# Patient Record
Sex: Male | Born: 1940 | Race: White | Hispanic: No | Marital: Married | Smoking: Former smoker
Health system: Southern US, Community
[De-identification: ages and names within clinical notes are randomized; demographics above are authoritative.]

## PROBLEM LIST (undated history)

## (undated) DIAGNOSIS — J449 Chronic obstructive pulmonary disease, unspecified: Secondary | ICD-10-CM

## (undated) DIAGNOSIS — E119 Type 2 diabetes mellitus without complications: Secondary | ICD-10-CM

---

## 2019-03-17 ENCOUNTER — Observation Stay
Admission: EM | Admit: 2019-03-17 | Discharge: 2019-03-18 | Disposition: A | Discharge status: Home / Self Care | Payer: Medicare Other | Attending: Internal Medicine | Admitting: Internal Medicine

## 2019-03-17 ENCOUNTER — Emergency Department
Admission: EM | Admit: 2019-03-17 | Discharge: 2019-03-17 | Disposition: A | Discharge status: Home / Self Care | Payer: Medicare Other | Source: Home / Self Care | Attending: Emergency Medicine | Admitting: Emergency Medicine

## 2019-03-17 ENCOUNTER — Emergency Department: Payer: Medicare Other

## 2019-03-17 ENCOUNTER — Other Ambulatory Visit: Payer: Self-pay

## 2019-03-17 ENCOUNTER — Encounter: Payer: Self-pay | Admitting: Emergency Medicine

## 2019-03-17 DIAGNOSIS — J849 Interstitial pulmonary disease, unspecified: Secondary | ICD-10-CM | POA: Diagnosis not present

## 2019-03-17 DIAGNOSIS — J9621 Acute and chronic respiratory failure with hypoxia: Secondary | ICD-10-CM | POA: Insufficient documentation

## 2019-03-17 DIAGNOSIS — Z955 Presence of coronary angioplasty implant and graft: Secondary | ICD-10-CM | POA: Diagnosis not present

## 2019-03-17 DIAGNOSIS — J449 Chronic obstructive pulmonary disease, unspecified: Secondary | ICD-10-CM | POA: Diagnosis present

## 2019-03-17 DIAGNOSIS — J9620 Acute and chronic respiratory failure, unspecified whether with hypoxia or hypercapnia: Secondary | ICD-10-CM | POA: Diagnosis present

## 2019-03-17 DIAGNOSIS — Z7902 Long term (current) use of antithrombotics/antiplatelets: Secondary | ICD-10-CM | POA: Diagnosis not present

## 2019-03-17 DIAGNOSIS — Z79899 Other long term (current) drug therapy: Secondary | ICD-10-CM | POA: Diagnosis not present

## 2019-03-17 DIAGNOSIS — Z794 Long term (current) use of insulin: Secondary | ICD-10-CM | POA: Insufficient documentation

## 2019-03-17 DIAGNOSIS — K219 Gastro-esophageal reflux disease without esophagitis: Secondary | ICD-10-CM | POA: Diagnosis not present

## 2019-03-17 DIAGNOSIS — I491 Atrial premature depolarization: Secondary | ICD-10-CM | POA: Diagnosis not present

## 2019-03-17 DIAGNOSIS — Z87891 Personal history of nicotine dependence: Secondary | ICD-10-CM | POA: Diagnosis not present

## 2019-03-17 DIAGNOSIS — I2510 Atherosclerotic heart disease of native coronary artery without angina pectoris: Secondary | ICD-10-CM | POA: Diagnosis not present

## 2019-03-17 DIAGNOSIS — Z20828 Contact with and (suspected) exposure to other viral communicable diseases: Secondary | ICD-10-CM | POA: Insufficient documentation

## 2019-03-17 DIAGNOSIS — R0789 Other chest pain: Secondary | ICD-10-CM | POA: Insufficient documentation

## 2019-03-17 DIAGNOSIS — N400 Enlarged prostate without lower urinary tract symptoms: Secondary | ICD-10-CM | POA: Diagnosis not present

## 2019-03-17 DIAGNOSIS — J189 Pneumonia, unspecified organism: Secondary | ICD-10-CM

## 2019-03-17 DIAGNOSIS — E119 Type 2 diabetes mellitus without complications: Secondary | ICD-10-CM | POA: Diagnosis not present

## 2019-03-17 DIAGNOSIS — I700 Atherosclerosis of aorta: Secondary | ICD-10-CM | POA: Insufficient documentation

## 2019-03-17 DIAGNOSIS — J441 Chronic obstructive pulmonary disease with (acute) exacerbation: Secondary | ICD-10-CM

## 2019-03-17 DIAGNOSIS — R079 Chest pain, unspecified: Secondary | ICD-10-CM

## 2019-03-17 DIAGNOSIS — E785 Hyperlipidemia, unspecified: Secondary | ICD-10-CM | POA: Diagnosis not present

## 2019-03-17 DIAGNOSIS — R0602 Shortness of breath: Secondary | ICD-10-CM | POA: Insufficient documentation

## 2019-03-17 HISTORY — DX: Type 2 diabetes mellitus without complications: E11.9

## 2019-03-17 HISTORY — DX: Chronic obstructive pulmonary disease, unspecified: J44.9

## 2019-03-17 LAB — BASIC METABOLIC PANEL
Anion gap: 10 (ref 5–15)
BUN: 24 mg/dL — ABNORMAL HIGH (ref 8–23)
CO2: 23 mmol/L (ref 22–32)
Calcium: 8.9 mg/dL (ref 8.9–10.3)
Chloride: 105 mmol/L (ref 98–111)
Creatinine, Ser: 0.63 mg/dL (ref 0.61–1.24)
GFR calc Af Amer: >60 mL/min (ref >60)
GFR calc non Af Amer: >60 mL/min (ref >60)
Glucose, Bld: 197 mg/dL — ABNORMAL HIGH (ref 70–99)
Potassium: 4.2 mmol/L (ref 3.5–5.1)
Sodium: 138 mmol/L (ref 135–145)

## 2019-03-17 LAB — BRAIN NATRIURETIC PEPTIDE: B Natriuretic Peptide: 106 pg/mL — ABNORMAL HIGH (ref 0.0–100.0)

## 2019-03-17 LAB — SARS CORONAVIRUS 2 (TAT 6-24 HRS): SARS Coronavirus 2: NEGATIVE

## 2019-03-17 LAB — TROPONIN I (HIGH SENSITIVITY)
Troponin I (High Sensitivity): 18 ng/L — ABNORMAL HIGH (ref <18)
Troponin I (High Sensitivity): 14 ng/L (ref <18)

## 2019-03-17 LAB — CBC
HCT: 38.6 % — ABNORMAL LOW (ref 39.0–52.0)
Hemoglobin: 12.1 g/dL — ABNORMAL LOW (ref 13.0–17.0)
MCH: 27.4 pg (ref 26.0–34.0)
MCHC: 31.3 g/dL (ref 30.0–36.0)
MCV: 87.5 fL (ref 80.0–100.0)
Platelets: 279 10*3/uL (ref 150–400)
RBC: 4.41 MIL/uL (ref 4.22–5.81)
RDW: 16.1 % — ABNORMAL HIGH (ref 11.5–15.5)
WBC: 7.4 10*3/uL (ref 4.0–10.5)
nRBC: 0 % (ref 0.0–0.2)

## 2019-03-17 LAB — GLUCOSE, CAPILLARY: Glucose-Capillary: 174 mg/dL — ABNORMAL HIGH (ref 70–99)

## 2019-03-17 LAB — LACTIC ACID, PLASMA: Lactic Acid, Venous: 2.2 mmol/L (ref 0.5–1.9)

## 2019-03-17 LAB — PROCALCITONIN: Procalcitonin: <0.1 ng/mL

## 2019-03-17 MED ORDER — INSULIN ASPART 100 UNIT/ML ~~LOC~~ SOLN: 0.0000 [IU] | Freq: Every day | SUBCUTANEOUS | Status: DC

## 2019-03-17 MED ORDER — PANTOPRAZOLE SODIUM 40 MG PO TBEC
40.0000 mg | DELAYED_RELEASE_TABLET | Freq: Every day | ORAL | Status: DC
  Administered 2019-03-18: 40 mg via ORAL
  Filled 2019-03-17: qty 1

## 2019-03-17 MED ORDER — AMLODIPINE BESYLATE 5 MG PO TABS
5.0000 mg | ORAL_TABLET | Freq: Every day | ORAL | Status: DC
  Administered 2019-03-18: 5 mg via ORAL
  Filled 2019-03-17: qty 1

## 2019-03-17 MED ORDER — INSULIN ASPART 100 UNIT/ML ~~LOC~~ SOLN
0.0000 [IU] | Freq: Three times a day (TID) | SUBCUTANEOUS | Status: DC
  Administered 2019-03-18: 2 [IU] via SUBCUTANEOUS
  Administered 2019-03-18: 5 [IU] via SUBCUTANEOUS
  Filled 2019-03-17 (×2): qty 1

## 2019-03-17 MED ORDER — PRAVASTATIN SODIUM 20 MG PO TABS
20.0000 mg | ORAL_TABLET | Freq: Every day | ORAL | Status: DC
  Administered 2019-03-17: 20 mg via ORAL
  Filled 2019-03-17: qty 1

## 2019-03-17 MED ORDER — SODIUM CHLORIDE 0.9 % IV SOLN
500.0000 mg | Freq: Once | INTRAVENOUS | Status: AC
  Administered 2019-03-17: 500 mg via INTRAVENOUS
  Filled 2019-03-17: qty 500

## 2019-03-17 MED ORDER — ACETAMINOPHEN 325 MG PO TABS: 650.0000 mg | ORAL_TABLET | Freq: Four times a day (QID) | ORAL | Status: DC | PRN

## 2019-03-17 MED ORDER — ALBUTEROL SULFATE (2.5 MG/3ML) 0.083% IN NEBU
2.5000 mg | INHALATION_SOLUTION | RESPIRATORY_TRACT | Status: DC | PRN
  Filled 2019-03-17: qty 3

## 2019-03-17 MED ORDER — ACETAMINOPHEN 650 MG RE SUPP: 650.0000 mg | Freq: Four times a day (QID) | RECTAL | Status: DC | PRN

## 2019-03-17 MED ORDER — OXYBUTYNIN CHLORIDE ER 10 MG PO TB24
10.0000 mg | ORAL_TABLET | Freq: Every day | ORAL | Status: DC
  Administered 2019-03-18: 09:00:00 10 mg via ORAL
  Filled 2019-03-17: qty 1

## 2019-03-17 MED ORDER — IPRATROPIUM-ALBUTEROL 0.5-2.5 (3) MG/3ML IN SOLN
6.0000 mL | Freq: Once | RESPIRATORY_TRACT | Status: AC
  Administered 2019-03-17: 6 mL via RESPIRATORY_TRACT
  Filled 2019-03-17: qty 3

## 2019-03-17 MED ORDER — ONDANSETRON HCL 4 MG PO TABS: 4.0000 mg | ORAL_TABLET | Freq: Four times a day (QID) | ORAL | Status: DC | PRN

## 2019-03-17 MED ORDER — ALBUTEROL SULFATE HFA 108 (90 BASE) MCG/ACT IN AERS: 2.0000 | INHALATION_SPRAY | RESPIRATORY_TRACT | Status: DC | PRN

## 2019-03-17 MED ORDER — AZITHROMYCIN 250 MG PO TABS: ORAL_TABLET | ORAL | 0 refills | Status: AC

## 2019-03-17 MED ORDER — MONTELUKAST SODIUM 10 MG PO TABS
10.0000 mg | ORAL_TABLET | Freq: Every day | ORAL | Status: DC
  Administered 2019-03-18: 09:00:00 10 mg via ORAL
  Filled 2019-03-17: qty 1

## 2019-03-17 MED ORDER — SODIUM CHLORIDE 0.9 % IV SOLN
1.0000 g | Freq: Once | INTRAVENOUS | Status: AC
  Administered 2019-03-17: 1 g via INTRAVENOUS
  Filled 2019-03-17: qty 10

## 2019-03-17 MED ORDER — AMOXICILLIN-POT CLAVULANATE 875-125 MG PO TABS: 1.0000 | ORAL_TABLET | Freq: Two times a day (BID) | ORAL | 0 refills | Status: AC

## 2019-03-17 MED ORDER — METHYLPREDNISOLONE SODIUM SUCC 125 MG IJ SOLR
60.0000 mg | Freq: Two times a day (BID) | INTRAMUSCULAR | Status: DC
  Administered 2019-03-18: 60 mg via INTRAVENOUS
  Filled 2019-03-17: qty 2

## 2019-03-17 MED ORDER — ENOXAPARIN SODIUM 40 MG/0.4ML ~~LOC~~ SOLN
40.0000 mg | SUBCUTANEOUS | Status: DC
  Administered 2019-03-18: 40 mg via SUBCUTANEOUS
  Filled 2019-03-17: qty 0.4

## 2019-03-17 MED ORDER — PREDNISONE 20 MG PO TABS: 40.0000 mg | ORAL_TABLET | Freq: Every day | ORAL | 0 refills | Status: AC

## 2019-03-17 MED ORDER — SENNOSIDES-DOCUSATE SODIUM 8.6-50 MG PO TABS: 1.0000 | ORAL_TABLET | Freq: Every evening | ORAL | Status: DC | PRN

## 2019-03-17 MED ORDER — SUCRALFATE 1 G PO TABS
1.0000 g | ORAL_TABLET | Freq: Every day | ORAL | Status: DC
  Administered 2019-03-17 – 2019-03-18 (×2): 1 g via ORAL
  Filled 2019-03-17 (×2): qty 1

## 2019-03-17 MED ORDER — METHYLPREDNISOLONE SODIUM SUCC 125 MG IJ SOLR
125.0000 mg | Freq: Once | INTRAMUSCULAR | Status: AC
  Administered 2019-03-17: 125 mg via INTRAVENOUS
  Filled 2019-03-17: qty 2

## 2019-03-17 MED ORDER — ALBUTEROL SULFATE (2.5 MG/3ML) 0.083% IN NEBU
2.5000 mg | INHALATION_SOLUTION | Freq: Four times a day (QID) | RESPIRATORY_TRACT | Status: DC
  Administered 2019-03-17 – 2019-03-18 (×3): 2.5 mg via RESPIRATORY_TRACT
  Filled 2019-03-17 (×3): qty 3

## 2019-03-17 MED ORDER — SODIUM CHLORIDE 0.9 % IV BOLUS
500.0000 mL | Freq: Once | INTRAVENOUS | Status: AC
  Administered 2019-03-17: 500 mL via INTRAVENOUS

## 2019-03-17 MED ORDER — ONDANSETRON HCL 4 MG/2ML IJ SOLN: 4.0000 mg | Freq: Four times a day (QID) | INTRAMUSCULAR | Status: DC | PRN

## 2019-03-17 MED ORDER — TAMSULOSIN HCL 0.4 MG PO CAPS
0.4000 mg | ORAL_CAPSULE | Freq: Every day | ORAL | Status: DC
  Administered 2019-03-17 – 2019-03-18 (×2): 0.4 mg via ORAL
  Filled 2019-03-17 (×2): qty 1

## 2019-03-17 MED ORDER — IPRATROPIUM-ALBUTEROL 0.5-2.5 (3) MG/3ML IN SOLN
3.0000 mL | Freq: Once | RESPIRATORY_TRACT | Status: AC
  Administered 2019-03-17: 3 mL via RESPIRATORY_TRACT
  Filled 2019-03-17: qty 3

## 2019-03-17 NOTE — ED Triage Notes (Signed)
Pt presents to ED via POV with c/o continued SP and SOB, pt states left this morning against medical advice and did not want to be admitted. Upon arrival Pt's O2 sats after ambulating 86% on RA.

## 2019-03-17 NOTE — ED Provider Notes (Signed)
[facility] Emergency Department Provider Note   ____________________________________________   First MD Initiated Contact with Patient 03/17/19 [phone]     (approximate)  I have reviewed the triage vital signs and the nursing notes.   HISTORY  Chief Complaint Chest Pain and Shortness of Breath    HPI Burk Hoctor is a 78 y.o. male who presents to the ED from home with a chief complaint of chest tightness.  Patient is visiting from out of town.  Reports awakening with chest tightness approximately 4 hours ago associated with shortness of breath.  States he was admitted to a hospital in [place], [place] last week for same.  Reports he had a heart cath which "didn't show nothing".  Sounds like patient became disgruntled with the nursing staff and left AMA after a 2 night stay.  States he was not placed on any new medications or nitroglycerin.  Denies fever, cough, abdominal pain, nausea, vomiting, palpitations, dizziness.       Past Medical History:  Diagnosis Date  . COPD (chronic obstructive pulmonary disease) (HCC)   . Diabetes mellitus without complication (HCC)     There are no active problems to display for this patient.   Prior to Admission medications   Not on File    Allergies Patient has no known allergies.  No family history on file.  Social History Social History   Tobacco Use  . Smoking status: Former Smoker  Substance Use Topics  . Alcohol use: Not Currently  . Drug use: Not on file    Review of Systems  Constitutional: No fever/chills Eyes: No visual changes. ENT: No sore throat. Cardiovascular: Positive for chest pain. Respiratory: Positive for shortness of breath. Gastrointestinal: No abdominal pain.  No nausea, no vomiting.  No diarrhea.  No constipation. Genitourinary: Negative for dysuria. Musculoskeletal: Negative for back pain. Skin: Negative for rash. Neurological: Negative for headaches, focal weakness or  numbness.   ____________________________________________   PHYSICAL EXAM:  VITAL SIGNS: ED Triage Vitals  Enc Vitals Group     BP 03/17/19 0528 (!) 147/60     Pulse Rate 03/17/19 0528 72     Resp 03/17/19 0528 18     Temp 03/17/19 0528 97.6 F (36.4 C)     Temp Source 03/17/19 0528 Oral     SpO2 03/17/19 0528 91 %     Weight 03/17/19 0528 140 lb (63.5 kg)     Height 03/17/19 0528 5\' 9"  (1.753 m)     Head Circumference --      Peak Flow --      Pain Score 03/17/19 0523 9     Pain Loc --      Pain Edu? --      Excl. in GC? --     Constitutional: Alert and oriented. Well appearing and in no acute distress. Eyes: Conjunctivae are normal. PERRL. EOMI. Head: Atraumatic. Nose: No congestion/rhinnorhea. Mouth/Throat: Mucous membranes are moist.  Oropharynx non-erythematous. Neck: No stridor.   Cardiovascular: Normal rate, regular rhythm. Grossly normal heart sounds.  Good peripheral circulation. Respiratory: Normal respiratory effort.  No retractions. Lungs with expiratory wheezing. Gastrointestinal: Soft and nontender. No distention. No abdominal bruits. No CVA tenderness. Musculoskeletal: No lower extremity tenderness nor edema.  No joint effusions. Neurologic:  Normal speech and language. No gross focal neurologic deficits are appreciated. No gait instability. Skin:  Skin is warm, dry and intact. No rash noted. Psychiatric: Mood and affect are normal. Speech and behavior are normal.  ____________________________________________  LABS (all labs ordered are listed, but only abnormal results are displayed)  Labs Reviewed  BASIC METABOLIC PANEL - Abnormal; Notable for the following components:      Result Value   Glucose, Bld 197 (*)    BUN 24 (*)    All other components within normal limits  CBC - Abnormal; Notable for the following components:   Hemoglobin 12.1 (*)    HCT 38.6 (*)    RDW 16.1 (*)    All other components within normal limits  TROPONIN I (HIGH  SENSITIVITY) - Abnormal; Notable for the following components:   Troponin I (High Sensitivity) 18 (*)    All other components within normal limits  CULTURE, BLOOD (ROUTINE X 2)  CULTURE, BLOOD (ROUTINE X 2)  SARS CORONAVIRUS 2 (TAT 6-24 HRS)  BRAIN NATRIURETIC PEPTIDE  LACTIC ACID, PLASMA  LACTIC ACID, PLASMA   ____________________________________________  EKG  ED ECG REPORT I, Loyd Salvador J, the attending physician, personally viewed and interpreted this ECG.   Date: 03/17/2019  EKG Time: 0533  Rate: 68  Rhythm: normal EKG, normal sinus rhythm  Axis: Normal  Intervals:none  ST&T Change: Nonspecific  ____________________________________________  RADIOLOGY  ED MD interpretation: Residual lung disease; more patchy opacity in the lungs, suspect infection  Official radiology report(s): Dg Chest Portable 1 View  Result Date: 03/17/2019 CLINICAL DATA:  Chest pain and shortness of breath, history of COPD and diabetes EXAM: PORTABLE CHEST 1 VIEW COMPARISON:  None. FINDINGS: There are diffuse coarse interstitial opacities throughout both lungs. More patchy opacities are present in the mid lung and periphery. No pneumothorax. No visible effusion of costophrenic sulci are collimated from view. The aorta is calcified. The remaining cardiomediastinal contours are unremarkable. No acute osseous or soft tissue abnormality. IMPRESSION: 1. Diffuse coarse interstitial opacities throughout both lungs suggestive some underlying interstitial lung disease. 2. More patchy opacities in the lungs may reflect a superimposed infection or edema. Electronically Signed   By: Lovena Le M.D.   On: 03/17/2019 06:12    ____________________________________________   PROCEDURES  Procedure(s) performed (including Critical Care):  Procedures   ____________________________________________   INITIAL IMPRESSION / ASSESSMENT AND PLAN / ED COURSE  As part of my medical decision making, I reviewed the  following data within the [place] History obtained from family, Nursing notes reviewed and incorporated, Labs reviewed, EKG interpreted, Old chart reviewed, Radiograph reviewed and Notes from prior ED visits     Roger Holmes was evaluated in Emergency Department on 03/17/2019 for the symptoms described in the history of present illness. He was evaluated in the context of the global COVID-19 pandemic, which necessitated consideration that the patient might be at risk for infection with the SARS-CoV-2 virus that causes COVID-19. Institutional protocols and algorithms that pertain to the evaluation of patients at risk for COVID-19 are in a state of rapid change based on information released by regulatory bodies including the CDC and federal and state organizations. These policies and algorithms were followed during the patient's care in the ED.    78 year old male with COPD and diabetes who presents with chest pain. Differential diagnosis includes, but is not limited to, ACS, aortic dissection, pulmonary embolism, cardiac tamponade, pneumothorax, pneumonia, pericarditis, myocarditis, GI-related causes including esophagitis/gastritis, and musculoskeletal chest wall pain.    Cannot see records for patient's most recent hospitalization in Epic.  Will have him sign consent form for paper records.  He does present a medication list which includes glimepiride, oxybutynin, flomax, sucralfate, lansoprazole,  pravastatin, Singulair, Metformin, Actos, amlodipine, Jardiance.  Obtain cardiac panel, chest x-ray.  Administer DuoNeb.  Patient states he cannot take aspirin due to "stomach issues".   Clinical Course as of Mar 16 656  Caleen EssexFri Mar 17, 2019  [phone] Updated patient and daughter on all test results thus far.  Will repeat times troponin, add BMP, blood cultures and lactic acid.  Administer IV Rocephin and Azithromycin for likely community-acquired pneumonia.  [facility] patient's records from last week's hospitalization.  Patient does not wish to stay, but is agreeable to finish the work-up.   [JS]    Clinical Course User Index [JS] Irean HongSung, Doreatha Offer J, MD     ____________________________________________   FINAL CLINICAL IMPRESSION(S) / ED DIAGNOSES  Final diagnoses:  Chest pain, unspecified type  COPD exacerbation [facility](HCC)     ED Discharge Orders    None       Note:  This document was prepared using Dragon voice recognition software and may include unintentional dictation errors.   Irean HongSung, Matsue Strom J, MD 03/17/19 [phone]

## 2019-03-17 NOTE — ED Notes (Signed)
Pt increased to 3L O2 per [place].

## 2019-03-17 NOTE — ED Provider Notes (Signed)
7:26 AM Assumed care for off going team.   Blood pressure (!) 149/61, pulse 67, temperature 97.6 F (36.4 C), temperature source Oral, resp. rate 17, height 5\' 9"  (1.753 m), weight 63.5 kg, SpO2 94 %.  See their HPI for full report but in brief   Chest tightness concerning for COPD. Cath last week. ILD on xray and possible PNA BC, lactate, repeat trop, on 2-3L.  Pt does not want to stay but okay with getting rest of workup. Getting COPD rx consider leaving with doxy/steroids.   9:03 AM reevaluated patient.  Patient ambulated well.  Briefly desatted to 88% but then recovered to 92% and did not feel short of breath with walking.  His lungs sound clear at this time.  I did offer patient admission for the pneumonia as well as the chest pain episode for cardiology evaluation.  Patient did not want to stay in the hospital.  He understands the risk include death and permanent disability.  He is capacity to make this decision.  Daughter is at bedside and understands patient's decision.  Offered repeat testing for his lactate after fluid but patient declined.         Vanessa , MD 03/17/19 [phone]

## 2019-03-17 NOTE — ED Notes (Signed)
Pt discharged home with daughter. Pt's O2 sat occasionally dips into upper 80's and rebounds quickly to 91-93%. Pt declines to stay in hospital but agrees to return if he gets worse. NAD noted.

## 2019-03-17 NOTE — ED Notes (Signed)
This RN spoke with Dr. Jari Pigg, since patient left several hours ago AMA, repeat EKG and chest X-ray, no labs at this time.

## 2019-03-17 NOTE — Discharge Instructions (Addendum)
We are concerned that you have pneumonia.  Your oxygen levels were just on the borderline.  We recommended admission for antibiotics and treatment of your COPD.  You prefer to go home.  We are still prescribing you antibiotics to take as well as steroids for your COPD.  You should return to the ER for worsening shortness of breath, chest pain or any other concerns.  I have given you a cardiology doctor that you can follow-up with.  Your coronavirus testing is pending and she is to quarantine for 24 hours until results.

## 2019-03-17 NOTE — Progress Notes (Signed)
CBG of 264

## 2019-03-17 NOTE — ED Triage Notes (Signed)
Pt arrives to ED for SOB and CP that is "tight everywhere" hx COPD, DM. Arrives A&O. States pain became bad about 4 hours ago.

## 2019-03-17 NOTE — ED Provider Notes (Signed)
[facility] Emergency Department Provider Note  Time seen: 5:20 PM  I have reviewed the triage vital signs and the nursing notes.   HISTORY  Chief Complaint Chest Pain and Shortness of Breath   HPI Roger Holmes is a 78 y.o. male with a past medical history of diabetes, COPD presents to the emergency department for shortness of breath starting this morning.  Patient was seen in the emergency department earlier today for the same, found to be hypoxic on room air however left AMA.  States he lives in [place] but is down here visiting his daughter.  Patient states he went back home after leaving AMA and his daughter told him he needs to go back to the hospital for admission.  Patient now agreeable to admission.  Patient has a history of COPD currently satting 86% on room air with no baseline O2 requirement.   Past Medical History:  Diagnosis Date  . COPD (chronic obstructive pulmonary disease) ([place])   . Diabetes mellitus without complication ([place])     There are no active problems to display for this patient.   History reviewed. No pertinent surgical history.  Prior to Admission medications   Medication Sig Start Date End Date Taking? Authorizing Provider  albuterol (VENTOLIN HFA) 108 (90 Base) MCG/ACT inhaler Inhale 2 puffs into the lungs every 4 (four) hours as needed. 01/15/19   [provider]  amLODipine (NORVASC) 10 MG tablet Take 5 mg by mouth daily. 01/11/19   [provider]  amoxicillin-clavulanate (AUGMENTIN) 875-125 MG tablet Take 1 tablet by mouth 2 (two) times daily for 7 days. 03/17/19 03/24/19  Vanessa Beaumont, MD  ANORO ELLIPTA 62.5-25 MCG/INH AEPB Inhale 1 puff into the lungs daily. 02/13/19   [provider]  azaTHIOprine (IMURAN) 50 MG tablet Take 50 mg by mouth 2 (two) times daily. 01/16/19   [provider]  azithromycin (ZITHROMAX) 250 MG tablet Take 1 tablet daily. 03/17/19   Vanessa [place], MD  CHANTIX  CONTINUING MONTH PAK 1 MG tablet Take 1 mg by mouth daily. 03/02/19   [provider]  glimepiride (AMARYL) 4 MG tablet Take 4 mg by mouth daily. 01/21/19   [provider]  JARDIANCE 25 MG TABS tablet Take 25 mg by mouth daily. 02/18/19   [provider]  metFORMIN (GLUCOPHAGE) 1000 MG tablet Take 1,000 mg by mouth 2 (two) times daily with a meal.    [provider]  montelukast (SINGULAIR) 10 MG tablet Take 10 mg by mouth daily. 01/06/19   [provider]  mupirocin ointment (BACTROBAN) 2 % Apply topically 2 (two) times daily. 03/07/19   [provider]  omeprazole (PRILOSEC) 40 MG capsule Take 40 mg by mouth 2 (two) times daily. 02/24/19   [provider]  oxybutynin (DITROPAN-XL) 10 MG 24 hr tablet Take 10 mg by mouth daily. 12/28/18   [provider]  pioglitazone (ACTOS) 30 MG tablet Take 30 mg by mouth daily. 01/21/19   [provider]  pravastatin (PRAVACHOL) 20 MG tablet Take 20 mg by mouth at bedtime. 01/06/19   [provider]  predniSONE (DELTASONE) 20 MG tablet Take 2 tablets (40 mg total) by mouth daily for 5 days. 03/17/19 03/22/19  Vanessa Polk, MD  sucralfate (CARAFATE) 1 g tablet Take 1 g by mouth daily. 01/16/19   [provider]  tamsulosin (FLOMAX) 0.4 MG CAPS capsule Take 0.4 mg by mouth daily. 03/13/19   [provider]  No Known Allergies  No family history on file.  Social History Social History   Tobacco Use  . Smoking status: Former Games developer  . Smokeless tobacco: Never Used  Substance Use Topics  . Alcohol use: Not Currently  . Drug use: Not on file    Review of Systems Constitutional: Negative for fever. Cardiovascular: Negative for chest pain. Respiratory: Positive shortness of breath.  Positive for cough. Gastrointestinal: Negative for abdominal pain Musculoskeletal: Negative for musculoskeletal complaints Neurological: Negative for headache All other  ROS negative  ____________________________________________   PHYSICAL EXAM:  VITAL SIGNS: ED Triage Vitals  Enc Vitals Group     BP 03/17/19 1300 (!) 152/47     Pulse Rate 03/17/19 1300 84     Resp 03/17/19 1300 (!) 24     Temp 03/17/19 1300 98.1 F (36.7 C)     Temp Source 03/17/19 1300 Oral     SpO2 03/17/19 1300 (!) 86 %     Weight 03/17/19 1301 140 lb (63.5 kg)     Height 03/17/19 1301 5\' 9"  (1.753 m)     Head Circumference --      Peak Flow --      Pain Score 03/17/19 1301 8     Pain Loc --      Pain Edu? --      Excl. in GC? --     Constitutional: Alert and oriented. Well appearing and in no distress. Eyes: Normal exam ENT      Head: Normocephalic and atraumatic.      Mouth/Throat: Mucous membranes are moist. Cardiovascular: Normal rate, regular rhythm. Respiratory: Normal respiratory effort without tachypnea nor retractions. Breath sounds are clear  Gastrointestinal: Soft and nontender. No distention. Musculoskeletal: Nontender with normal range of motion in all extremities.  Neurologic:  Normal speech and language. No gross focal neurologic deficits  Skin:  Skin is warm, dry and intact.  Psychiatric: Mood and affect are normal.   ____________________________________________    EKG  EKG viewed and interpreted by myself shows a sinus rhythm at 77 bpm with a narrow QRS, normal axis, normal intervals, no concerning ST changes.  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   Atherosclerosis.   ____________________________________________   INITIAL IMPRESSION / ASSESSMENT AND PLAN / ED COURSE  Pertinent labs & imaging results that were available during my care of the patient were reviewed by me and considered in my medical decision making (see chart for details).   Patient presents to the emergency department for shortness of breath.  History of COPD currently satting in the 80s on  room air with no baseline O2 requirement.  Patient was seen here earlier today had reassuring lab work including a negative Covid test.  We will start the patient on antibiotics, duo nebs and Solu-Medrol.  Patient agreeable to admission to the hospital.  Patient satting in the mid 90s on 3 L of oxygen.  Roger Holmes was evaluated in Emergency Department on 03/17/2019 for the symptoms described in the history of present illness. He was evaluated in the context of the global COVID-19 pandemic, which necessitated consideration that the patient might be at risk for infection with the SARS-CoV-2 virus that causes COVID-19. Institutional protocols and algorithms that pertain to the evaluation of patients at risk for COVID-19 are in a state of rapid change based on information released by regulatory bodies including the CDC and federal and state organizations. These policies and algorithms were followed during the  patient's care in the ED.  ____________________________________________   FINAL CLINICAL IMPRESSION(S) / ED DIAGNOSES  COPD exacerbation Hypoxia   Minna AntisPaduchowski, Derryck Shahan, MD 03/17/19 1723

## 2019-03-17 NOTE — H&P (Addendum)
[facility] [place] NAME: Roger Holmes    MR#:  [id number]  DATE OF BIRTH:  08/23/1940  DATE OF ADMISSION:  03/17/2019  PRIMARY CARE PHYSICIAN: Peter Congo, MD   REQUESTING/REFERRING PHYSICIAN: dr Kerman Passey Patient coming from : [employer] [place], [place] lives with wife   CHIEF COMPLAINT:   Increasing SOB since ydday HISTORY OF PRESENT ILLNESS:  Roger Holmes  is a 78 y.o. male with a known history of  Emphysema, DM-2, BPH GERD, CAD s/p stent 2  who comes to the ER with increasing sob. Patient is visiting daughter from [place] for Thanksgiving. He came to the emergency room was found to be hypoxic on room air however he decided to leave AMA after he got treatment. Patient felt better however after going home he started having increasing shortness of breath and his daughter brought him back to the emergency room.  In the emergency room patient had sats of 86% on room air, heart rate 85, blood pressure 136 or 52. His stats are currently 92 on 2 L oxygen. Chest x-ray shows emphysema. No evidence of pneumonia. White count is normal. Patient is being admitted for acute on chronic hypoxic respiratory failure secondary to COPD exacerbation. He received IV Rocephin and Zithromax along with nebulizer treatment and Solu-Medrol in the ER  Patient reports he is quit smoking about a year ago. He uses inhalers at home he does not have oxygen or nebulizer at baseline. PAST MEDICAL HISTORY:   Past Medical History:  Diagnosis Date  . COPD (chronic obstructive pulmonary disease) ([place])   . Diabetes mellitus without complication ([place])     PAST SURGICAL HISTOIRY:  History reviewed. No pertinent surgical history.  SOCIAL HISTORY:   Social History   Tobacco Use  . Smoking status: Former Research scientist (life sciences)  . Smokeless tobacco: Never Used  Substance Use Topics  . Alcohol use: Not Currently    FAMILY HISTORY:  No family history on file.  DRUG ALLERGIES:  No  Known Allergies  REVIEW OF SYSTEMS:  Review of Systems  Constitutional: Negative for chills, fever and weight loss.  HENT: Negative for ear discharge, ear pain and nosebleeds.   Eyes: Negative for blurred vision, pain and discharge.  Respiratory: Positive for shortness of breath and wheezing. Negative for sputum production and stridor.   Cardiovascular: Negative for chest pain, palpitations, orthopnea and PND.  Gastrointestinal: Negative for abdominal pain, diarrhea, nausea and vomiting.  Genitourinary: Negative for frequency and urgency.  Musculoskeletal: Negative for back pain and joint pain.  Neurological: Positive for weakness. Negative for sensory change, speech change and focal weakness.  Psychiatric/Behavioral: Negative for depression and hallucinations. The patient is not nervous/anxious.      MEDICATIONS AT HOME:   Prior to Admission medications   Medication Sig Start Date End Date Taking? Authorizing Provider  albuterol (VENTOLIN HFA) 108 (90 Base) MCG/ACT inhaler Inhale 2 puffs into the lungs every 4 (four) hours as needed. 01/15/19   [provider]  amLODipine (NORVASC) 10 MG tablet Take 5 mg by mouth daily. 01/11/19   [provider]  amoxicillin-clavulanate (AUGMENTIN) 875-125 MG tablet Take 1 tablet by mouth 2 (two) times daily for 7 days. 03/17/19 03/24/19  Vanessa Lanesboro, MD  ANORO ELLIPTA 62.5-25 MCG/INH AEPB Inhale 1 puff into the lungs daily. 02/13/19   [provider]  azaTHIOprine (IMURAN) 50 MG tablet Take 50 mg by mouth 2 (two) times daily. 01/16/19   [provider]  azithromycin Azucena Fallen)  250 MG tablet Take 1 tablet daily. 03/17/19   Concha Se, MD  CHANTIX CONTINUING MONTH PAK 1 MG tablet Take 1 mg by mouth daily. 03/02/19   [provider]  glimepiride (AMARYL) 4 MG tablet Take 4 mg by mouth daily. 01/21/19   [provider]  JARDIANCE 25 MG TABS tablet Take 25 mg by mouth daily. 02/18/19   [provider]  metFORMIN (GLUCOPHAGE) 1000 MG tablet Take 1,000 mg by mouth 2 (two) times daily with a meal.    [provider]  montelukast (SINGULAIR) 10 MG tablet Take 10 mg by mouth daily. 01/06/19   [provider]  mupirocin ointment (BACTROBAN) 2 % Apply topically 2 (two) times daily. 03/07/19   [provider]  omeprazole (PRILOSEC) 40 MG capsule Take 40 mg by mouth 2 (two) times daily. 02/24/19   [provider]  oxybutynin (DITROPAN-XL) 10 MG 24 hr tablet Take 10 mg by mouth daily. 12/28/18   [provider]  pioglitazone (ACTOS) 30 MG tablet Take 30 mg by mouth daily. 01/21/19   [provider]  pravastatin (PRAVACHOL) 20 MG tablet Take 20 mg by mouth at bedtime. 01/06/19   [provider]  predniSONE (DELTASONE) 20 MG tablet Take 2 tablets (40 mg total) by mouth daily for 5 days. 03/17/19 03/22/19  Concha Se, MD  sucralfate (CARAFATE) 1 g tablet Take 1 g by mouth daily. 01/16/19   [provider]  tamsulosin (FLOMAX) 0.4 MG CAPS capsule Take 0.4 mg by mouth daily. 03/13/19   [provider]      VITAL SIGNS:  Blood pressure (!) 152/47, pulse 84, temperature 98.1 F (36.7 C), temperature source Oral, resp. rate (!) 24, height 5\' 9"  (1.753 m), weight 63.5 kg, SpO2 93 %.  PHYSICAL EXAMINATION:  GENERAL:  78 y.o.-year-old patient lying in the bed with no acute distress. Thin EYES: Pupils equal, round, reactive to light and accommodation. No scleral icterus. Extraocular muscles intact.  HEENT: Head atraumatic, normocephalic. Oropharynx and nasopharynx clear.  NECK:  Supple, no jugular venous distention. No thyroid enlargement, no tenderness.  LUNGS: emphysematous chest breath sounds bilaterally, scattered wheezing,  No rales,rhonchi or crepitation. No use of accessory muscles of respiration.  CARDIOVASCULAR: S1, S2 normal. No murmurs, rubs, or gallops.  ABDOMEN: Soft, nontender, nondistended. Bowel sounds  present. No organomegaly or mass.  EXTREMITIES: No pedal edema, cyanosis, or clubbing.  NEUROLOGIC: Cranial nerves II through XII are intact. Muscle strength 5/5 in all extremities. Sensation intact. Gait not checked.  PSYCHIATRIC: The patient is alert and oriented x 3.  SKIN: No obvious rash, lesion, or ulcer.   LABORATORY PANEL:   CBC Recent Labs  Lab 03/17/19 0527  WBC 7.4  HGB 12.1*  HCT 38.6*  PLT 279   ------------------------------------------------------------------------------------------------------------------  Chemistries  Recent Labs  Lab 03/17/19 0527  NA 138  K 4.2  CL 105  CO2 23  GLUCOSE 197*  BUN 24*  CREATININE 0.63  CALCIUM 8.9   ------------------------------------------------------------------------------------------------------------------  Cardiac Enzymes No results for input(s): TROPONINI in the last 168 hours. ------------------------------------------------------------------------------------------------------------------  RADIOLOGY:  Dg Chest 2 View  Result Date: 03/17/2019 CLINICAL DATA:  Chest pain and shortness of breath. EXAM: CHEST - 2 VIEW COMPARISON:  Single-view of the chest 03/17/2019. FINDINGS: The lungs are clear but appears severely emphysematous. No pneumothorax or pleural effusion. Heart size is normal. Atherosclerosis noted. No acute or focal bony abnormality. 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 Atherosclerosis. Electronically Signed  By: Drusilla Kannerhomas  Dalessio M.D.   On: 03/17/2019 13:49   Dg Chest Portable 1 View  Result Date: 03/17/2019 CLINICAL DATA:  Chest pain and shortness of breath, history of COPD and diabetes EXAM: PORTABLE CHEST 1 VIEW COMPARISON:  None. FINDINGS: There are diffuse coarse interstitial opacities throughout both lungs. More patchy opacities are present in the mid lung and periphery. No pneumothorax. No visible effusion of costophrenic sulci are collimated from view. The aorta is calcified.  The remaining cardiomediastinal contours are unremarkable. No acute osseous or soft tissue abnormality. IMPRESSION: 1. Diffuse coarse interstitial opacities throughout both lungs suggestive some underlying interstitial lung disease. 2. More patchy opacities in the lungs may reflect a superimposed infection or edema. Electronically Signed   By: Kreg ShropshirePrice  DeHay M.D.   On: 03/17/2019 06:12    EKG:    IMPRESSION AND PLAN:   Roger Holmes  is a 78 y.o. male with a known history of  Emphysema, DM-2, BPH GERD, CAD s/p stent 2  who comes to the ER with increasing sob. Patient is visiting daughter from [place] for Thanksgiving. He came to the emergency room was found to be hypoxic on room air however he decided to leave AMA after he got treatment. Patient felt better however after going home he started having increasing shortness of breath and his daughter brought him back to the emergency room.  1. Acute on chronic hypoxic respiratory failure secondary to COPD exacerbation/emphysema -patient came in with increasing shortness of breath left AMA how were came back since he was not feeling better -IV Solu-Medrol 60 BID, nebulizer PRN, inhalers -patient received IV Rocephin and Zithromax. Chest x-ray does not show evidence of pneumonia. White count is normal. -Will check Pro calcitonin. If negative discontinue antibiotics -assess for home oxygen use.  2. Type II diabetes, non-insulin requiring, uncomplicated -sliding scale insulin -will resume home medications one medication  reconciliation is completed.  3. History of coronary artery disease status post stenting recently according to the daughter -continue his cardiac meds -patient denies any chest pain. No STT changes on telemonitoring -patient's daughter is not sure if patient is on antiplatelet agents. I will wait till medication reconciliation is done  4. BPH continue Flomax  5. Hyperlipidemia continue pravastatin   Family  Communication: Audelia ActonStephanie Lamica daughter on the phone Consults: none Code Status: full code discussed with patient in the ER DVT prophylaxis: Lovenox  TOTAL TIME TAKING CARE OF THIS PATIENT: *60* minutes.    Enedina FinnerSona Vayla Wilhelmi M.D on 03/17/2019 at 5:54 PM  Between 7am to 6pm - Pager - [phone]  After 6pm go to [email or web address] - password [email or web address] [facility]    CC: Primary care physician; Donney Diceank, Harb, MD

## 2019-03-18 DIAGNOSIS — J441 Chronic obstructive pulmonary disease with (acute) exacerbation: Secondary | ICD-10-CM | POA: Diagnosis not present

## 2019-03-18 DIAGNOSIS — J449 Chronic obstructive pulmonary disease, unspecified: Secondary | ICD-10-CM | POA: Diagnosis present

## 2019-03-18 DIAGNOSIS — E785 Hyperlipidemia, unspecified: Secondary | ICD-10-CM | POA: Diagnosis not present

## 2019-03-18 DIAGNOSIS — J9621 Acute and chronic respiratory failure with hypoxia: Secondary | ICD-10-CM | POA: Diagnosis not present

## 2019-03-18 DIAGNOSIS — J439 Emphysema, unspecified: Secondary | ICD-10-CM | POA: Diagnosis not present

## 2019-03-18 LAB — GLUCOSE, CAPILLARY
Glucose-Capillary: 172 mg/dL — ABNORMAL HIGH (ref 70–99)
Glucose-Capillary: 277 mg/dL — ABNORMAL HIGH (ref 70–99)

## 2019-03-18 LAB — HEMOGLOBIN A1C
Hgb A1c MFr Bld: 7.2 % — ABNORMAL HIGH (ref 4.8–5.6)
Mean Plasma Glucose: 159.94 mg/dL

## 2019-03-18 MED ORDER — ORAL CARE MOUTH RINSE
15.0000 mL | Freq: Two times a day (BID) | OROMUCOSAL | Status: DC
  Administered 2019-03-18: 09:00:00 15 mL via OROMUCOSAL

## 2019-03-18 NOTE — Discharge Summary (Signed)
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 COPD (chronic obstructive pulmonary disease) (HCC)   . Diabetes mellitus without complication [facility])     HOSPITAL COURSE:   Roger Holmes  is a 78 y.o. male with a known history of  Emphysema, DM-2, BPH GERD, CAD s/p stent 2  who comes to the ER with increasing sob. Patient is visiting daughter from [place] for Thanksgiving. He came to the emergency room was found to be hypoxic on room air however he decided to leave AMA after he got treatment. Patient felt better however after going home he started having increasing shortness of breath and his daughter brought him back to the emergency room.  1. Acute on chronic hypoxic respiratory failure secondary to COPD exacerbation/emphysema -patient came in with increasing shortness of breath left AMA how were came back since he was not feeling better -IV Solu-Medrol 60 BID--change to steroid taper (rx called in yday by ER MD), nebulizer PRN, inhalers -patient received IV Rocephin and Zithromax.  -Chest x-ray does not show evidence of pneumonia. White count is normal. - Pro calcitonin negative. -assess for home oxygen use--pt does not qualify for home oxygen per assessment. -pt improved earlier than expected and requesting to go home  2. Type II diabetes, non-insulin requiring,  uncomplicated, hyperglycemia due to steoirds -sliding scale insulin -metformin, Jardiance and amaryl. Resume Actos if sugars go beyond 200  3. History of Ischemic colitis s/p recent stents placed in [place] and [place] vessel in [place]  4. BPH continue Flomax  5. Hyperlipidemia continue pravastatin  6. Chronic interstitial Lung disease On Imuran  overall improved. Dter agrees. Pt is feeling better and anxious to be discharged. He is worried about  his wife at home  Family Communication: Roger Holmes daughter in the room Consults: none Code Status: full code discussed with patient in the ER DVT prophylaxis: Lovenox  CONSULTS OBTAINED:    DRUG ALLERGIES:  No Known Allergies  DISCHARGE MEDICATIONS:   Allergies as of 03/18/2019   No Known Allergies     Medication List    STOP taking these medications   pioglitazone 30 MG tablet Commonly known as: ACTOS     TAKE these medications   albuterol 108 (90 Base) MCG/ACT inhaler Commonly known as: VENTOLIN HFA Inhale 2 puffs into the lungs every 4 (four) hours as needed.   amLODipine 10 MG tablet Commonly known as: NORVASC Take 5 mg by mouth daily.   amoxicillin-clavulanate 875-125 MG tablet Commonly known as: Augmentin Take 1 tablet by mouth 2 (two) times daily for 7 days.   Anoro Ellipta 62.5-25 MCG/INH Aepb Generic drug: umeclidinium-vilanterol Inhale 1 puff into the lungs daily.   azaTHIOprine 50 MG tablet Commonly known as: IMURAN Take 50 mg by mouth 2 (two) times daily.   azithromycin 250 MG tablet Commonly known as: Zithromax Take 1 tablet daily.   Chantix Continuing Month Pak 1 MG tablet Generic drug: varenicline Take  1 mg by mouth daily.   glimepiride 4 MG tablet Commonly known as: AMARYL Take 4 mg by mouth daily.   Jardiance 25 MG Tabs tablet Generic drug: empagliflozin Take 25 mg by mouth daily.   metFORMIN 1000 MG tablet Commonly known as: GLUCOPHAGE Take 1,000 mg by mouth 2 (two) times daily  with a meal.   montelukast 10 MG tablet Commonly known as: SINGULAIR Take 10 mg by mouth daily.   mupirocin ointment 2 % Commonly known as: BACTROBAN Apply topically 2 (two) times daily.   omeprazole 40 MG capsule Commonly known as: PRILOSEC Take 40 mg by mouth 2 (two) times daily.   oxybutynin 10 MG 24 hr tablet Commonly known as: DITROPAN-XL Take 10 mg by mouth daily.   pravastatin 20 MG tablet Commonly known as: PRAVACHOL Take 20 mg by mouth at bedtime.   predniSONE 20 MG tablet Commonly known as: Deltasone Take 2 tablets (40 mg total) by mouth daily for 5 days.   sucralfate 1 g tablet Commonly known as: CARAFATE Take 1 g by mouth daily.   tamsulosin 0.4 MG Caps capsule Commonly known as: FLOMAX Take 0.4 mg by mouth daily.       If you experience worsening of your admission symptoms, develop shortness of breath, life threatening emergency, suicidal or homicidal thoughts you must seek medical attention immediately by calling 911 or calling your MD immediately  if symptoms less severe.  You Must read complete instructions/literature along with all the possible adverse reactions/side effects for all the Medicines you take and that have been prescribed to you. Take any new Medicines after you have completely understood and accept all the possible adverse reactions/side effects.   Please note  You were cared for by a hospitalist during your hospital stay. If you have any questions about your discharge medications or the care you received while you were in the hospital after you are discharged, you can call the unit and asked to speak with the hospitalist on call if the hospitalist that took care of you is not available. Once you are discharged, your primary care physician will handle any further medical issues. Please note that NO REFILLS for any discharge medications will be authorized once you are discharged, as it is imperative that you return to your primary care  physician (or establish a relationship with a primary care physician if you do not have one) for your aftercare needs so that they can reassess your need for medications and monitor your lab values. Today   SUBJECTIVE  I feel better  No sob--able to complete sentence without getting sob. Requesitng to be discharged dter in the room  VITAL SIGNS:  Blood pressure (!) 131/57, pulse 85, temperature 97.7 F (36.5 C), temperature source Oral, resp. rate 18, height 5\' 9"  (1.753 m), weight 63 kg, SpO2 93 %.  I/O:    Intake/Output Summary (Last 24 hours) at 03/18/2019 1153 Last data filed at 03/18/2019 0315 Gross per 24 hour  Intake 270.78 ml  Output 200 ml  Net 70.78 ml    PHYSICAL EXAMINATION:  GENERAL:  78 y.o.-year-old patient lying in the bed with no acute distress. THin EYES: Pupils equal, round, reactive to light and accommodation. No scleral icterus. Extraocular muscles intact.  HEENT: Head atraumatic, normocephalic. Oropharynx and nasopharynx clear.  NECK:  Supple, no jugular venous distention. No thyroid enlargement, no tenderness.  LUNGS: Normal distant breath sounds bilaterally, no wheezing, rales,rhonchi or crepitation. No use of accessory muscles of respiration. Emp[hysematous chest  CARDIOVASCULAR: S1, S2 normal. No murmurs, rubs, or gallops.  ABDOMEN: Soft, non-tender, non-distended. Bowel sounds present. No organomegaly or mass.  EXTREMITIES: No pedal edema, cyanosis, or clubbing.  NEUROLOGIC: Cranial nerves II through XII are intact. Muscle strength 5/5 in all extremities. Sensation intact. Gait not checked.  PSYCHIATRIC: The patient is alert and oriented x 3.  SKIN: No obvious rash, lesion, or ulcer.   DATA REVIEW:   CBC  Recent Labs  Lab 03/17/19 0527  WBC 7.4  HGB 12.1*  HCT 38.6*  PLT 279    Chemistries  Recent Labs  Lab 03/17/19 0527  NA 138  K 4.2  CL 105  CO2 23  GLUCOSE 197*  BUN 24*  CREATININE 0.63  CALCIUM 8.9    Microbiology Results    Recent Results (from the past 240 hour(s))  Culture, blood (routine x 2)     Status: None (Preliminary result)   Collection Time: 03/17/19  7:31 AM   Specimen: BLOOD  Result Value Ref Range Status   Specimen Description BLOOD BLOOD RIGHT ARM  Final   Special Requests   Final    BOTTLES DRAWN AEROBIC AND ANAEROBIC Blood Culture adequate volume   Culture   Final    NO GROWTH < 24 HOURS Performed at [facility], [address] [place], [place] [address]    Report Status PENDING  Incomplete  Culture, blood (routine x 2)     Status: None (Preliminary result)   Collection Time: 03/17/19  7:31 AM   Specimen: BLOOD  Result Value Ref Range Status   Specimen Description BLOOD BLOOD RIGHT FOREARM  Final   Special Requests   Final    BOTTLES DRAWN AEROBIC AND ANAEROBIC Blood Culture adequate volume   Culture   Final    NO GROWTH < 24 HOURS Performed at [facility], [address] [place], [place] [address]    Report Status PENDING  Incomplete  SARS CORONAVIRUS 2 (TAT 6-24 HRS) Nasopharyngeal Nasopharyngeal Swab     Status: None   Collection Time: 03/17/19  7:40 AM   Specimen: Nasopharyngeal Swab  Result Value Ref Range Status   SARS Coronavirus 2 NEGATIVE NEGATIVE Final    Comment: (NOTE) SARS-CoV-2 target nucleic acids are NOT DETECTED. The SARS-CoV-2 RNA is generally detectable in upper and lower respiratory specimens during the acute phase of infection. Negative results do not preclude SARS-CoV-2 infection, do not rule out co-infections with other pathogens, and should not be used as the sole basis for treatment or other patient management decisions. Negative results must be combined with clinical observations, patient history, and epidemiological information. The expected result is Negative. Fact Sheet for Patients: SugarRoll.be Fact Sheet for Healthcare Providers: https://www.woods-mathews.com/ This test is not  yet approved or cleared by the [place] FDA and  has been authorized for detection and/or diagnosis of SARS-CoV-2 by FDA under an Emergency Use Authorization (EUA). This EUA will remain  in effect (meaning this test can be used) for the duration of the COVID-19 declaration under Section 56 4(b)(1) of the Act, 21 U.S.C. section 360bbb-3(b)(1), unless the authorization is terminated or revoked sooner. Performed at [facility], [place] [address], [place], [place] [address]   Blood culture (routine x 2)     Status: None (Preliminary result)   Collection Time: 03/17/19  6:12 PM   Specimen: BLOOD  Result Value Ref Range Status   Specimen Description BLOOD LEFT ANTECUBITAL  Final   Special Requests   Final  BOTTLES DRAWN AEROBIC AND ANAEROBIC Blood Culture adequate volume   Culture   Final    NO GROWTH < 12 HOURS Performed at [facility], [address] [place], [place] [address]    Report Status PENDING  Incomplete  Blood culture (routine x 2)     Status: None (Preliminary result)   Collection Time: 03/17/19  6:12 PM   Specimen: BLOOD  Result Value Ref Range Status   Specimen Description BLOOD BLOOD RIGHT FOREARM  Final   Special Requests   Final    BOTTLES DRAWN AEROBIC AND ANAEROBIC Blood Culture adequate volume   Culture   Final    NO GROWTH < 12 HOURS Performed at [facility], [address], [place], [place] [address]    Report Status PENDING  Incomplete    RADIOLOGY:  Dg Chest 2 View  Result Date: 03/17/2019 CLINICAL DATA:  Chest pain and shortness of breath. EXAM: CHEST - 2 VIEW COMPARISON:  Single-view of the chest 03/17/2019. FINDINGS: The lungs are clear but appears severely emphysematous. No pneumothorax or pleural effusion. Heart size is normal. Atherosclerosis noted. No acute or focal bony abnormality. 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 Atherosclerosis. Electronically Signed   By: Drusilla Kanner M.D.   On: 03/17/2019  13:49   Dg Chest Portable 1 View  Result Date: 03/17/2019 CLINICAL DATA:  Chest pain and shortness of breath, history of COPD and diabetes EXAM: PORTABLE CHEST 1 VIEW COMPARISON:  None. FINDINGS: There are diffuse coarse interstitial opacities throughout both lungs. More patchy opacities are present in the mid lung and periphery. No pneumothorax. No visible effusion of costophrenic sulci are collimated from view. The aorta is calcified. The remaining cardiomediastinal contours are unremarkable. No acute osseous or soft tissue abnormality. IMPRESSION: 1. Diffuse coarse interstitial opacities throughout both lungs suggestive some underlying interstitial lung disease. 2. More patchy opacities in the lungs may reflect a superimposed infection or edema. Electronically Signed   By: Kreg Shropshire M.D.   On: 03/17/2019 06:12     CODE STATUS:     Code Status Orders  (From admission, onward)         Start     Ordered   03/17/19 2020  Full code  Continuous     03/17/19 2019        Code Status History    This patient has a current code status but no historical code status.   Advance Care Planning Activity     Family Discussion: daughter Judeth Cornfield in the room Consults:none   TOTAL TIME TAKING CARE OF THIS PATIENT: *40* minutes.    Roger Holmes M.D on 03/18/2019 at 11:53 AM  Between 7am to 6pm - Pager - [phone] After 6pm go to [email or web address] - password [email or web address]  [facility]    CC: Primary care physician; Roger Dice, MD

## 2019-03-18 NOTE — Plan of Care (Signed)

## 2019-03-18 NOTE — Progress Notes (Signed)
SATURATION QUALIFICATIONS: (This note is used to comply with regulatory documentation for home oxygen)  Patient Saturations on Room Air at Rest = 91%  Patient Saturations on Room Air while Ambulating = 91%  Patient Saturations on 2 Liters of oxygen while Ambulating = 93%  Please briefly explain why patient needs home oxygen: Patient did not appear to be in any respiratory distress when ambulating without Oxygen. Per MD - patient does not qualify for home oxygen.

## 2019-03-18 NOTE — Progress Notes (Signed)
Patient discharged. Patient transported to daughter's home [facility]) via POV. Discharge paperwork reviewed with, and given to, patient and patient's daughter. Patient and patient's daughter verbalized understanding.

## 2019-03-18 NOTE — Care Management CC44 (Signed)
Condition Code 44 Documentation Completed  Patient Details  Name: Roger Holmes MRN: [id number] Date of Birth: 1940/07/24   Condition Code 44 given:  Yes Patient signature on Condition Code 44 notice:  Yes Documentation of 2 MD's agreement:  Yes Code 44 added to claim:  Yes    Gerrianne Scale Nyxon Strupp, LCSW 03/18/2019, 12:28 PM

## 2019-03-18 NOTE — Discharge Instructions (Signed)
Pt advised to use his inhalers at home Keep log of your sugars at home

## 2019-03-20 LAB — GLUCOSE, CAPILLARY: Glucose-Capillary: 264 mg/dL — ABNORMAL HIGH (ref 70–99)

## 2019-03-27 ENCOUNTER — Other Ambulatory Visit: Payer: Self-pay

## 2019-03-27 DIAGNOSIS — Z20822 Contact with and (suspected) exposure to covid-19: Secondary | ICD-10-CM

## 2019-03-27 LAB — CULTURE, BLOOD (ROUTINE X 2)
Culture: NO GROWTH
Culture: NO GROWTH
Culture: NO GROWTH
Culture: NO GROWTH
Special Requests: ADEQUATE
Special Requests: ADEQUATE
Special Requests: ADEQUATE
Special Requests: ADEQUATE

## 2019-03-29 LAB — NOVEL CORONAVIRUS, NAA: SARS-CoV-2, NAA: NOT DETECTED

## 2019-07-20 DEATH — deceased

## 2021-05-31 IMAGING — CR DG CHEST 2V
1 series · 2 of 2 positions shown · non-contrast
Comparison: Single-view of the chest 03/17/2019.

CLINICAL DATA: Chest pain and shortness of breath.

EXAM:
CHEST - 2 VIEW

[Series 1: dg chest 2 view · 0.14mm/px · 2 of 2 slices shown]
[im 1/2]
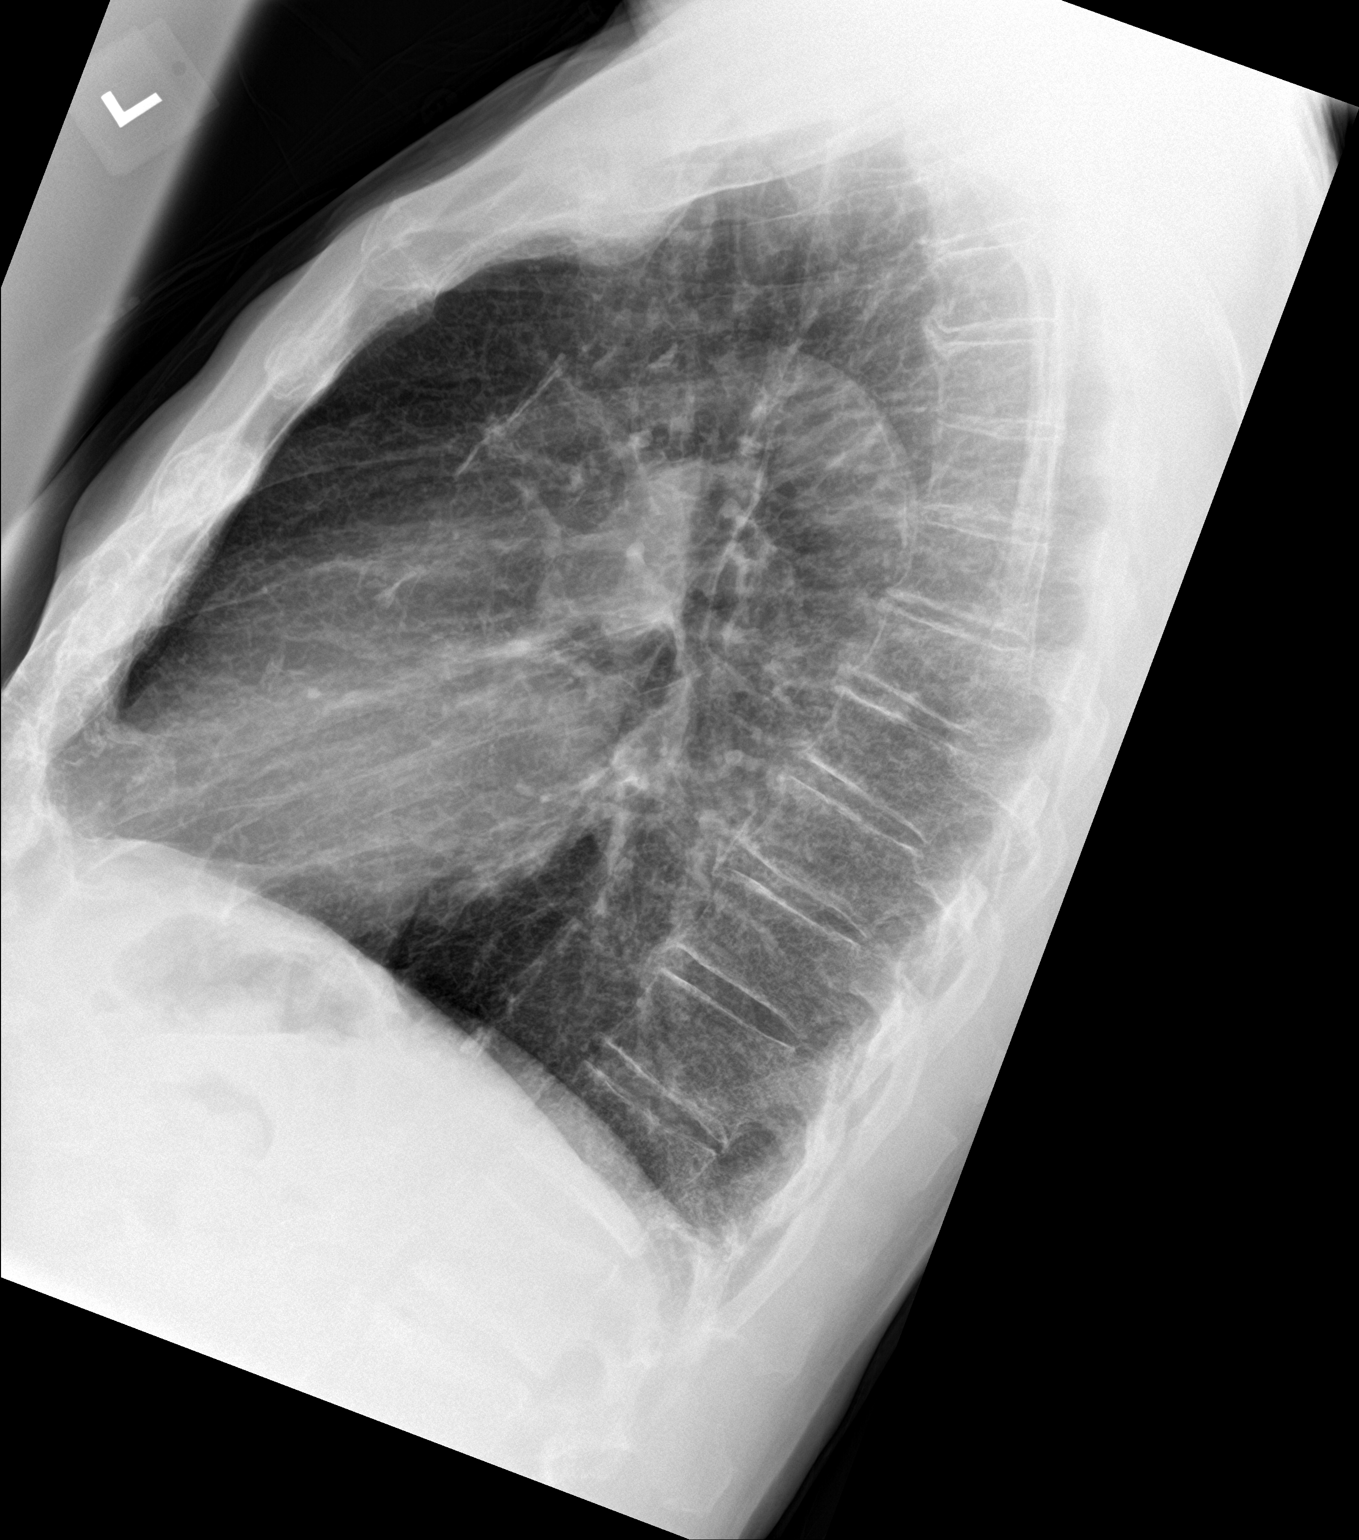
[im 2/2]
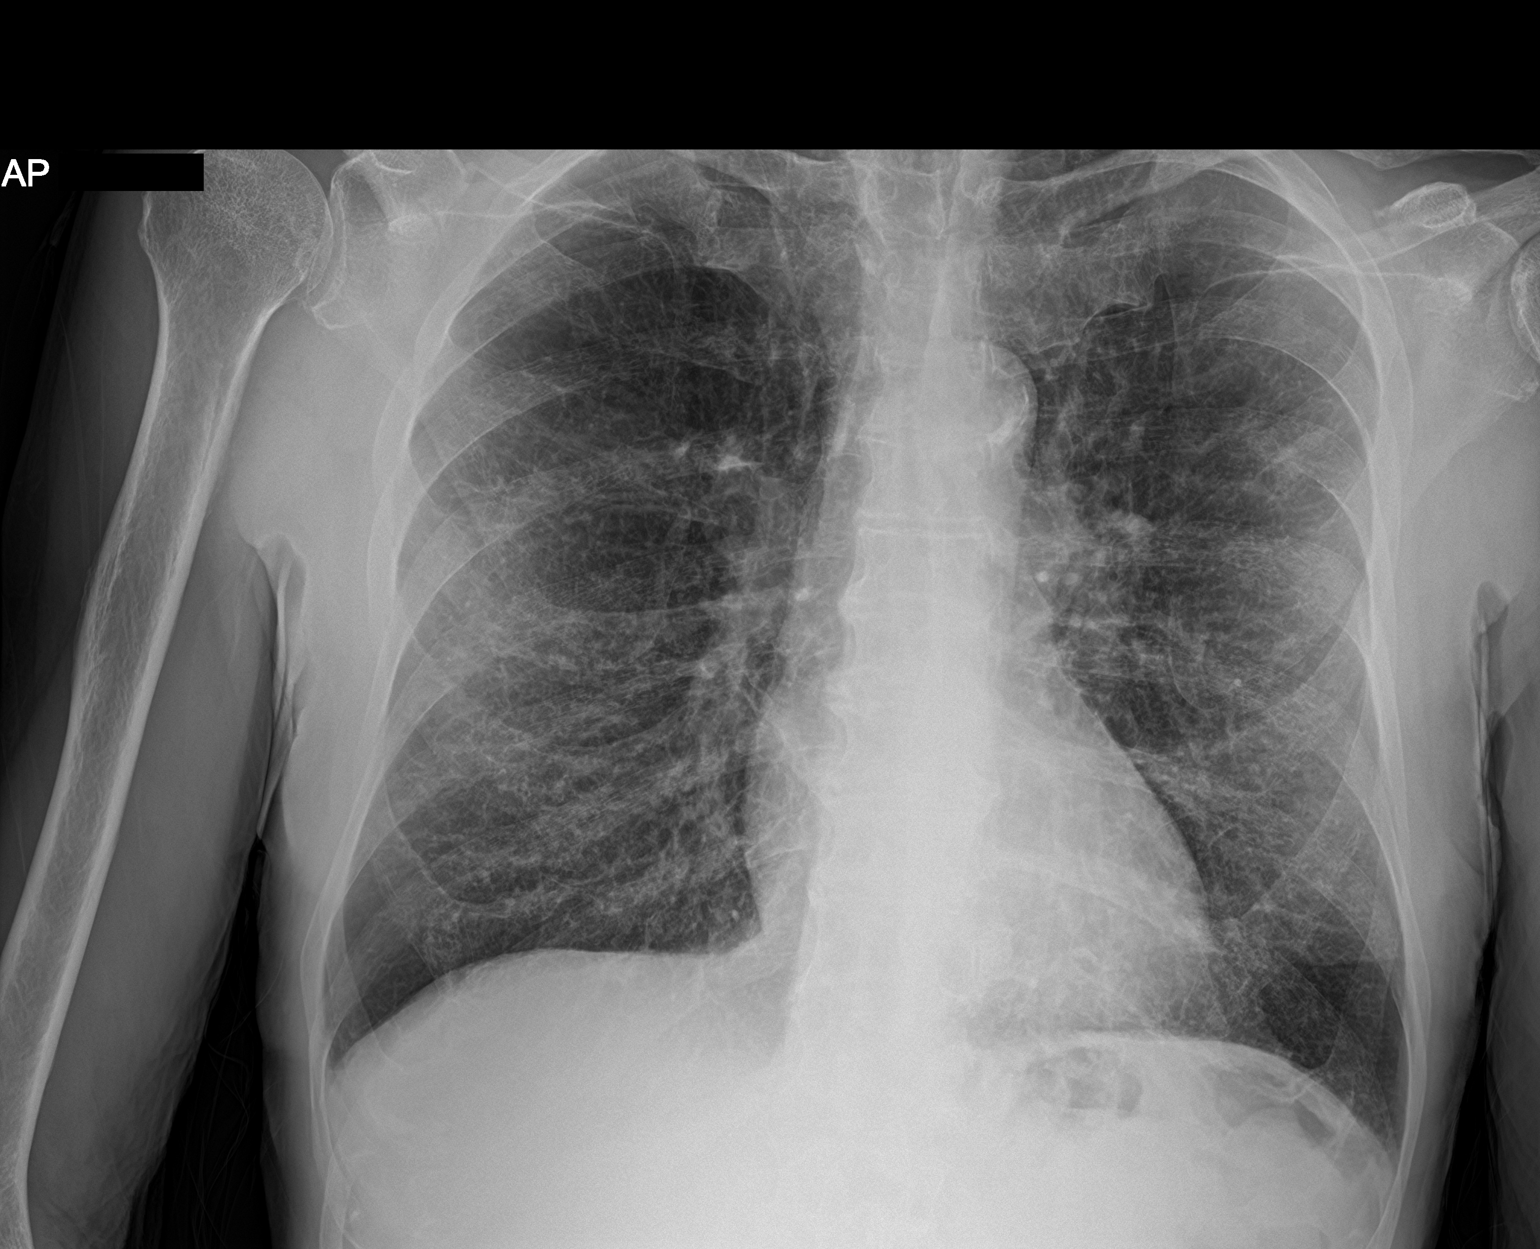

[2 of 2 positions shown; findings below may reference images not displayed]

FINDINGS: The lungs are clear but appears severely emphysematous. No
pneumothorax or pleural effusion. Heart size is normal.
Atherosclerosis noted. No acute or focal bony abnormality.
IMPRESSION: Marked emphysema without acute disease.

Atherosclerosis.
